# Patient Record
Sex: Female | Born: 1985 | Race: White | Hispanic: No | Marital: Single | Smoking: Never smoker
Health system: Southern US, Community
[De-identification: ages and names within clinical notes are randomized; demographics above are authoritative.]

## PROBLEM LIST (undated history)

## (undated) HISTORY — PX: LEFT HEART CATH: CATH118248

## (undated) HISTORY — PX: LOOP RECORDER REMOVAL: EP1215

## (undated) HISTORY — PX: LOOP RECORDER INSERTION: EP1214

---

## 2017-05-01 ENCOUNTER — Other Ambulatory Visit: Payer: Self-pay

## 2017-05-01 ENCOUNTER — Ambulatory Visit
Admission: EM | Admit: 2017-05-01 | Discharge: 2017-05-01 | Disposition: A | Discharge status: Home / Self Care | Payer: Medicaid Other | Attending: Emergency Medicine | Admitting: Emergency Medicine

## 2017-05-01 DIAGNOSIS — R05 Cough: Secondary | ICD-10-CM

## 2017-05-01 DIAGNOSIS — J111 Influenza due to unidentified influenza virus with other respiratory manifestations: Secondary | ICD-10-CM

## 2017-05-01 DIAGNOSIS — R509 Fever, unspecified: Secondary | ICD-10-CM

## 2017-05-01 DIAGNOSIS — J029 Acute pharyngitis, unspecified: Secondary | ICD-10-CM

## 2017-05-01 DIAGNOSIS — M791 Myalgia, unspecified site: Secondary | ICD-10-CM | POA: Diagnosis not present

## 2017-05-01 DIAGNOSIS — R69 Illness, unspecified: Secondary | ICD-10-CM

## 2017-05-01 MED ORDER — OSELTAMIVIR PHOSPHATE 75 MG PO CAPS: 75.0000 mg | ORAL_CAPSULE | Freq: Two times a day (BID) | ORAL | 0 refills | Status: AC

## 2017-05-01 NOTE — Discharge Instructions (Signed)
-  Tamiflu: one tablet twice a day for 5 days °-push fluids, rest °-ibuprofen or Tylenol for pain and fever °-follow up with PCP or return to clinic should symptoms worsen or not improve. °

## 2017-05-01 NOTE — ED Provider Notes (Signed)
Patient is a 32 year old [facility]    CSN: [id number] Arrival date & time: 05/01/17  1345     History   Chief Complaint Chief Complaint  Patient presents with  . Fever    HPI Alexis Frey is a 32 y.o. female.   Patient is a 32 year old female who presents with her daughter (who has similar symptoms) with complaint of body aches, chills, cough, scratchy throat and a fever with T-max of 103 that began Tuesday.  Patient reports that she has productive cough that is occasionally producing green mucus.  She reports some chest pain shortness of breath yesterday.  Patient denies any abdominal pain but does report some nausea.  Patient denies any sick contacts at work but she does present with her daughter who has similar symptoms.  Her other daughter was also sick over the weekend but is improved.      History reviewed. No pertinent past medical history.  There are no active problems to display for this patient.   Past Surgical History:  Procedure Laterality Date  . CESAREAN SECTION      x 3  . LEFT HEART CATH    . LOOP RECORDER INSERTION    . LOOP RECORDER REMOVAL      OB History    No data available       Home Medications    Prior to Admission medications   Medication Sig Start Date End Date Taking? Authorizing Provider  oseltamivir (TAMIFLU) 75 MG capsule Take 1 capsule (75 mg total) by mouth every 12 (twelve) hours. 05/01/17   Candis Schatz, PA-C    Family History Family History  Problem Relation Age of Onset  . Heart disease Father   . CVA Father   . Heart attack Father   . Kidney disease Father     Social History Social History   Tobacco Use  . Smoking status: Never Smoker  . Smokeless tobacco: Never Used  Substance Use Topics  . Alcohol use: No    Frequency: Never  . Drug use: No     Allergies   Benadryl [diphenhydramine] and Demerol [meperidine hcl]   Review of Systems Review of Systems  As noted above in HPI.  Other  systems reviewed and found to be negative   Physical Exam Triage Vital Signs ED Triage Vitals  Enc Vitals Group     BP 05/01/17 1421 109/60     Pulse Rate 05/01/17 1421 86     Resp 05/01/17 1421 16     Temp 05/01/17 1421 98.1 F (36.7 C)     Temp Source 05/01/17 1421 Oral     SpO2 05/01/17 1421 100 %     Weight --      Height 05/01/17 1418 5\' 5"  (1.651 m)     Head Circumference --      Peak Flow --      Pain Score 05/01/17 1418 5     Pain Loc --      Pain Edu? --      Excl. in GC? --    No data found.  Updated Vital Signs BP 109/60 (BP Location: Left Arm)   Pulse 86   Temp 98.1 F (36.7 C) (Oral)   Resp 16   Ht 5\' 5"  (1.651 m)   LMP 04/14/2017   SpO2 100%   Physical Exam  Constitutional: She is oriented to person, place, and time. She appears well-developed and well-nourished. No distress.  HENT:  Head: Normocephalic and  atraumatic.  Right Ear: Tympanic membrane and ear canal normal.  Left Ear: Tympanic membrane and ear canal normal.  Mouth/Throat: Uvula is midline.  Positive postnasal drainage, mild throat erythema  Eyes: EOM are normal. Pupils are equal, round, and reactive to light.  Neck: Normal range of motion.  Cardiovascular: Normal rate and regular rhythm. Exam reveals no friction rub.  No murmur heard. Pulmonary/Chest: Effort normal and breath sounds normal. No respiratory distress. She has no wheezes.  Abdominal: Soft.  Musculoskeletal: Normal range of motion.  Lymphadenopathy:    She has no cervical adenopathy.  Neurological: She is alert and oriented to person, place, and time. No cranial nerve deficit.  Skin: Skin is warm and dry.  Psychiatric: She has a normal mood and affect. Her behavior is normal.     UC Treatments / Results  Labs (all labs ordered are listed, but only abnormal results are displayed) Labs Reviewed - No data to display  EKG  EKG Interpretation None       Radiology No results found.  Procedures Procedures  (including critical care time)  Medications Ordered in UC Medications - No data to display   Initial Impression / Assessment and Plan / UC Course  I have reviewed the triage vital signs and the nursing notes.  Pertinent labs & imaging results that were available during my care of the patient were reviewed by me and considered in my medical decision making (see chart for details).     Patient presents with flulike illness with reported T-max of 103.  Patient also reports the same time as her daughter with similar symptoms.  We will go ahead and treat her for influenza with Tamiflu.  I have patient push fluids and ibuprofen or Tylenol for pain.  Follow with primary care or this clinic as needed.  Final Clinical Impressions(s) / UC Diagnoses   Final diagnoses:  Influenza-like illness    ED Discharge Orders        Ordered    oseltamivir (TAMIFLU) 75 MG capsule  Every 12 hours     05/01/17 1556       Controlled Substance Prescriptions  Controlled Substance Registry consulted? Not Applicable   Candis SchatzHarris, Havard Radigan D, PA-C 05/01/17 1558

## 2017-05-01 NOTE — ED Triage Notes (Signed)
Patient complains of cough, congestion, fever, body aches that started on Tuesday PM.

## 2018-01-01 ENCOUNTER — Encounter: Payer: Self-pay | Admitting: Emergency Medicine

## 2018-01-01 ENCOUNTER — Ambulatory Visit
Admission: EM | Admit: 2018-01-01 | Discharge: 2018-01-01 | Disposition: A | Discharge status: Home / Self Care | Payer: Medicaid Other | Attending: Family Medicine | Admitting: Family Medicine

## 2018-01-01 ENCOUNTER — Other Ambulatory Visit: Payer: Self-pay

## 2018-01-01 DIAGNOSIS — R1310 Dysphagia, unspecified: Secondary | ICD-10-CM | POA: Diagnosis not present

## 2018-01-01 DIAGNOSIS — Z8249 Family history of ischemic heart disease and other diseases of the circulatory system: Secondary | ICD-10-CM | POA: Diagnosis not present

## 2018-01-01 DIAGNOSIS — J020 Streptococcal pharyngitis: Secondary | ICD-10-CM

## 2018-01-01 DIAGNOSIS — J039 Acute tonsillitis, unspecified: Secondary | ICD-10-CM | POA: Diagnosis not present

## 2018-01-01 DIAGNOSIS — Z79899 Other long term (current) drug therapy: Secondary | ICD-10-CM | POA: Diagnosis not present

## 2018-01-01 DIAGNOSIS — J029 Acute pharyngitis, unspecified: Secondary | ICD-10-CM | POA: Diagnosis present

## 2018-01-01 LAB — RAPID STREP SCREEN (MED CTR MEBANE ONLY): Streptococcus, Group A Screen (Direct): POSITIVE — AB

## 2018-01-01 MED ORDER — LIDOCAINE VISCOUS HCL 2 % MT SOLN: 15.0000 mL | OROMUCOSAL | 0 refills | Status: AC | PRN

## 2018-01-01 MED ORDER — PENICILLIN G BENZATHINE 1200000 UNIT/2ML IM SUSP
1.2000 10*6.[IU] | Freq: Once | INTRAMUSCULAR | Status: AC
  Administered 2018-01-01: 1.2 10*6.[IU] via INTRAMUSCULAR

## 2018-01-01 NOTE — ED Triage Notes (Signed)
Patient sore throat that started this morning.

## 2018-01-01 NOTE — Discharge Instructions (Signed)
Your rapid strep test was positive today. You have been given an injection of penicillin today for the infection which should clear your infection in the next 1-3 days. If pain worsens or persists please f/u. May take Motrin and use viscous lidocaine for pain. Increase rest and fluids. F/u as needed.

## 2018-01-01 NOTE — ED Provider Notes (Signed)
[facility]    CSN: [id number] Arrival date & time: 01/01/18  1943     History   Chief Complaint Chief Complaint  Patient presents with  . Sore Throat    HPI Alexis Frey is a 32 y.o. female. Patient presents with sore throat x 1 day. States her daughter was recently seen in the clinic and treated for strep throat. Patient's throat pain is 5/10. Admits to painful swallowing and tender/enlarged lymph nodes of neck. Denies fever. Admits to feeling hot and cold and fatigued. Has not taken anything for symptoms at this time.  HPI  History reviewed. No pertinent past medical history.  There are no active problems to display for this patient.   Past Surgical History:  Procedure Laterality Date  . CESAREAN SECTION      x 3  . LEFT HEART CATH    . LOOP RECORDER INSERTION    . LOOP RECORDER REMOVAL      OB History   None      Home Medications    Prior to Admission medications   Medication Sig Start Date End Date Taking? Authorizing Provider  venlafaxine (EFFEXOR) 37.5 MG tablet Take 1 tab daily for a week then increase to 1 tab twice a day 11/12/17  Yes [provider]  lidocaine (XYLOCAINE) 2 % solution Use as directed 15 mLs in the mouth or throat every 3 (three) hours as needed for up to 2 days for mouth pain. 01/01/18 01/03/18  Shirlee Latch, PA-C  oseltamivir (TAMIFLU) 75 MG capsule Take 1 capsule (75 mg total) by mouth every 12 (twelve) hours. 05/01/17   Candis Schatz, PA-C    Family History Family History  Problem Relation Age of Onset  . Heart disease Father   . CVA Father   . Heart attack Father   . Kidney disease Father     Social History Social History   Tobacco Use  . Smoking status: Never Smoker  . Smokeless tobacco: Never Used  Substance Use Topics  . Alcohol use: No    Frequency: Never  . Drug use: No     Allergies   Benadryl [diphenhydramine] and Demerol [meperidine hcl]   Review of Systems Review of Systems   Constitutional: Positive for chills and fatigue. Negative for appetite change and fever.  HENT: Positive for sore throat and trouble swallowing. Negative for congestion, ear pain, rhinorrhea, sinus pressure and sinus pain.   Eyes: Negative for discharge and redness.  Respiratory: Negative for cough and shortness of breath.   Cardiovascular: Negative for chest pain.  Gastrointestinal: Negative for abdominal pain, nausea and vomiting.  Musculoskeletal: Negative for arthralgias and myalgias.  Skin: Negative for color change and rash.  Neurological: Negative for dizziness, weakness and headaches.  Hematological: Positive for adenopathy.     Physical Exam Triage Vital Signs ED Triage Vitals  Enc Vitals Group     BP 01/01/18 1948 123/69     Pulse Rate 01/01/18 1948 (!) 104     Resp 01/01/18 1948 14     Temp 01/01/18 1948 98.1 F (36.7 C)     Temp Source 01/01/18 1948 Oral     SpO2 01/01/18 1948 100 %     Weight 01/01/18 1946 197 lb (89.4 kg)     Height 01/01/18 1946 5\' 5"  (1.651 m)     Head Circumference --      Peak Flow --      Pain Score 01/01/18 1946 5  Pain Loc --      Pain Edu? --      Excl. in GC? --    No data found.  Updated Vital Signs BP 123/69 (BP Location: Left Arm)   Pulse (!) 104   Temp 98.1 F (36.7 C) (Oral)   Resp 14   Ht 5\' 5"  (1.651 m)   Wt 197 lb (89.4 kg)   LMP 12/11/2017 (Approximate)   SpO2 100%   BMI 32.78 kg/m    Physical Exam  Constitutional: She is oriented to person, place, and time. She appears well-developed and well-nourished.  Non-toxic appearance. She does not appear ill. No distress.  HENT:  Head: Normocephalic and atraumatic.  Right Ear: Hearing, tympanic membrane and ear canal normal.  Left Ear: Tympanic membrane and ear canal normal.  Mouth/Throat: Mucous membranes are normal. Posterior oropharyngeal erythema present. No oropharyngeal exudate or tonsillar abscesses. Tonsils are 3+ on the right. Tonsils are 2+ on the left.  Tonsillar exudate (bilateral whitish exudates ).  Neck: Neck supple.  Cardiovascular: Normal rate, regular rhythm and normal heart sounds.  No murmur heard. Pulmonary/Chest: Effort normal and breath sounds normal. No respiratory distress. She has no wheezes. She has no rhonchi.  Lymphadenopathy:    She has cervical adenopathy (bilat ant enlarged cervical nodes with + tenderness).  Neurological: She is alert and oriented to person, place, and time.  Skin: Skin is dry. No rash noted.  Psychiatric: She has a normal mood and affect. Her behavior is normal.  Nursing note and vitals reviewed.    UC Treatments / Results  Labs (all labs ordered are listed, but only abnormal results are displayed) Labs Reviewed  RAPID STREP SCREEN ([facility] ONLY) - Abnormal; Notable for the following components:      Result Value   Streptococcus, Group A Screen (Direct) POSITIVE (*)    All other components within normal limits    EKG None  Radiology No results found.  Procedures Procedures (including critical care time)  Medications Ordered in UC Medications  penicillin g benzathine (BICILLIN LA) 1200000 UNIT/2ML injection 1.2 Million Units (1.2 Million Units Intramuscular Given 01/01/18 2009)    Initial Impression / Assessment and Plan / UC Course  I have reviewed the triage vital signs and the nursing notes.  Pertinent labs & imaging results that were available during my care of the patient were reviewed by me and considered in my medical decision making (see chart for details).   Rapid strep test positive. Patient requested IM penicillin instead of oral medication. Increase rest and fluid intake. Discussed when to follow up for worsening of infection.    Final Clinical Impressions(s) / UC Diagnoses   Final diagnoses:  Strep pharyngitis  Tonsillitis  Dysphagia, unspecified type     Discharge Instructions     Your rapid strep test was positive today. You have been given an  injection of penicillin today for the infection which should clear your infection in the next 1-3 days. If pain worsens or persists please f/u. May take Motrin and use viscous lidocaine for pain. Increase rest and fluids. F/u as needed.    ED Prescriptions    Medication Sig Dispense Auth. Provider   lidocaine (XYLOCAINE) 2 % solution Use as directed 15 mLs in the mouth or throat every 3 (three) hours as needed for up to 2 days for mouth pain. 150 mL Eusebio Friendly B, PA-C     Controlled Substance Prescriptions [place] Controlled Substance Registry consulted? Not Applicable  Shirlee Latch, PA-C 01/03/18 1419

## 2018-01-01 NOTE — ED Notes (Signed)
Patient shows no signs of adverse reaction at this time.  

## 2018-05-05 ENCOUNTER — Other Ambulatory Visit: Payer: Self-pay | Admitting: Acute Care

## 2018-05-05 DIAGNOSIS — G4459 Other complicated headache syndrome: Secondary | ICD-10-CM

## 2018-05-15 ENCOUNTER — Other Ambulatory Visit: Payer: Self-pay

## 2018-05-15 ENCOUNTER — Ambulatory Visit
Admission: RE | Admit: 2018-05-15 | Discharge: 2018-05-15 | Disposition: A | Discharge status: Home / Self Care | Payer: 59 | Source: Ambulatory Visit | Attending: Acute Care | Admitting: Acute Care

## 2018-05-15 DIAGNOSIS — G4459 Other complicated headache syndrome: Secondary | ICD-10-CM | POA: Diagnosis present

## 2018-05-15 MED ORDER — GADOBUTROL 1 MMOL/ML IV SOLN
7.5000 mL | Freq: Once | INTRAVENOUS | Status: AC | PRN
  Administered 2018-05-15: 7.5 mL via INTRAVENOUS

## 2019-06-24 ENCOUNTER — Other Ambulatory Visit: Payer: Self-pay

## 2019-06-24 ENCOUNTER — Emergency Department
Admission: EM | Admit: 2019-06-24 | Discharge: 2019-06-24 | Disposition: A | Discharge status: Home / Self Care | Payer: 59 | Attending: Emergency Medicine | Admitting: Emergency Medicine

## 2019-06-24 ENCOUNTER — Emergency Department: Payer: 59

## 2019-06-24 DIAGNOSIS — H81399 Other peripheral vertigo, unspecified ear: Secondary | ICD-10-CM | POA: Diagnosis not present

## 2019-06-24 DIAGNOSIS — R079 Chest pain, unspecified: Secondary | ICD-10-CM

## 2019-06-24 DIAGNOSIS — R0789 Other chest pain: Secondary | ICD-10-CM | POA: Diagnosis present

## 2019-06-24 LAB — CBC
HCT: 40.7 % (ref 36.0–46.0)
Hemoglobin: 14 g/dL (ref 12.0–15.0)
MCH: 30.3 pg (ref 26.0–34.0)
MCHC: 34.4 g/dL (ref 30.0–36.0)
MCV: 88.1 fL (ref 80.0–100.0)
Platelets: 326 10*3/uL (ref 150–400)
RBC: 4.62 MIL/uL (ref 3.87–5.11)
RDW: 12.3 % (ref 11.5–15.5)
WBC: 7.2 10*3/uL (ref 4.0–10.5)
nRBC: 0 % (ref 0.0–0.2)

## 2019-06-24 LAB — BASIC METABOLIC PANEL
Anion gap: 8 (ref 5–15)
BUN: 12 mg/dL (ref 6–20)
CO2: 26 mmol/L (ref 22–32)
Calcium: 9.6 mg/dL (ref 8.9–10.3)
Chloride: 107 mmol/L (ref 98–111)
Creatinine, Ser: 0.7 mg/dL (ref 0.44–1.00)
GFR calc Af Amer: >60 mL/min (ref >60)
GFR calc non Af Amer: >60 mL/min (ref >60)
Glucose, Bld: 98 mg/dL (ref 70–99)
Potassium: 4.3 mmol/L (ref 3.5–5.1)
Sodium: 141 mmol/L (ref 135–145)

## 2019-06-24 LAB — TROPONIN I (HIGH SENSITIVITY)
Troponin I (High Sensitivity): <2 ng/L (ref <18)
Troponin I (High Sensitivity): <2 ng/L (ref <18)

## 2019-06-24 LAB — POCT PREGNANCY, URINE: Preg Test, Ur: NEGATIVE

## 2019-06-24 MED ORDER — IOHEXOL 350 MG/ML SOLN
75.0000 mL | Freq: Once | INTRAVENOUS | Status: AC | PRN
  Administered 2019-06-24: 13:00:00 75 mL via INTRAVENOUS

## 2019-06-24 MED ORDER — KETOROLAC TROMETHAMINE 30 MG/ML IJ SOLN
15.0000 mg | INTRAMUSCULAR | Status: AC
  Administered 2019-06-24: 12:00:00 15 mg via INTRAVENOUS
  Filled 2019-06-24: qty 1

## 2019-06-24 MED ORDER — MECLIZINE HCL 25 MG PO TABS: 25.0000 mg | ORAL_TABLET | Freq: Three times a day (TID) | ORAL | 1 refills | Status: AC | PRN

## 2019-06-24 MED ORDER — MECLIZINE HCL 25 MG PO TABS
50.0000 mg | ORAL_TABLET | Freq: Once | ORAL | Status: AC
  Administered 2019-06-24: 12:00:00 50 mg via ORAL
  Filled 2019-06-24: qty 2

## 2019-06-24 MED ORDER — SODIUM CHLORIDE 0.9% FLUSH
3.0000 mL | Freq: Once | INTRAVENOUS | Status: AC
  Administered 2019-06-24: 12:00:00 3 mL via INTRAVENOUS

## 2019-06-24 MED ORDER — LORAZEPAM 2 MG/ML IJ SOLN
1.0000 mg | Freq: Once | INTRAMUSCULAR | Status: AC
  Administered 2019-06-24: 1 mg via INTRAVENOUS
  Filled 2019-06-24: qty 1

## 2019-06-24 MED ORDER — SODIUM CHLORIDE 0.9 % IV BOLUS
1000.0000 mL | Freq: Once | INTRAVENOUS | Status: AC
Start: 2019-06-24 — End: 2019-06-24
  Administered 2019-06-24: 12:00:00 1000 mL via INTRAVENOUS

## 2019-06-24 MED ORDER — ONDANSETRON 4 MG PO TBDP: 4.0000 mg | ORAL_TABLET | Freq: Three times a day (TID) | ORAL | 0 refills | Status: AC | PRN

## 2019-06-24 NOTE — Discharge Instructions (Signed)
Your labs and CT scan of the chest were all okay today.  Take meclizine to help control the vertigo, and stay well-hydrated.  Drinking caffeine can also help.  Please follow-up with your doctor for further evaluation of your symptoms.

## 2019-06-24 NOTE — ED Provider Notes (Signed)
[facility] Emergency Department Provider Note  ____________________________________________  Time seen: Approximately 1:21 PM  I have reviewed the triage vital signs and the nursing notes.   HISTORY  Chief Complaint Chest Pain    HPI Alexis Frey is a 34 y.o. female with a history of atypical chest pain and syncope more than 3 years ago who comes the ED complaining of sudden onset of dizziness and room spinning feeling that started as she sat up to get out of bed this morning.  Symptom was associated with a feeling of sharp pain in the left upper chest radiating into the left neck and the left arm.  The pain has been constant without alleviating factors  throughout the morning.  She states that it is better now, but was aggravated by lifting her arm to have a chest x-ray taken earlier in the ED.  Reports extensive cardiac work-up in [place] prior to moving to [place] 3 years ago.  She had a loop recorder, cardiac catheterization, other testing without any definitive conclusions.  Not currently taking any medications.  Denies any paresthesia or motor weakness.  No headache or syncope.  No vision change.     History reviewed. No pertinent past medical history.   There are no problems to display for this patient.    Past Surgical History:  Procedure Laterality Date  . CESAREAN SECTION      x 3  . LEFT HEART CATH    . LOOP RECORDER INSERTION    . LOOP RECORDER REMOVAL       Prior to Admission medications   Medication Sig Start Date End Date Taking? Authorizing Provider  meclizine (ANTIVERT) 25 MG tablet Take 1 tablet (25 mg total) by mouth 3 (three) times daily as needed for dizziness or nausea. 06/24/19   Sharman Cheek, MD  ondansetron (ZOFRAN ODT) 4 MG disintegrating tablet Take 1 tablet (4 mg total) by mouth every 8 (eight) hours as needed for nausea or vomiting. 06/24/19   Sharman Cheek, MD  oseltamivir (TAMIFLU) 75 MG capsule Take 1  capsule (75 mg total) by mouth every 12 (twelve) hours. 05/01/17   Candis Schatz, PA-C  venlafaxine [facility]) 37.5 MG tablet Take 1 tab daily for a week then increase to 1 tab twice a day 11/12/17   [provider]     Allergies Benadryl [diphenhydramine] and Demerol [meperidine hcl]   Family History  Problem Relation Age of Onset  . Heart disease Father   . CVA Father   . Heart attack Father   . Kidney disease Father     Social History Social History   Tobacco Use  . Smoking status: Never Smoker  . Smokeless tobacco: Never Used  Substance Use Topics  . Alcohol use: No  . Drug use: No    Review of Systems  Constitutional:   No fever or chills.  ENT:   No sore throat. No rhinorrhea. Cardiovascular: Positive chest pain as above without palpitations or syncope. Respiratory:   No dyspnea or cough. Gastrointestinal:   Negative for abdominal pain, vomiting and diarrhea.  Musculoskeletal:   Negative for focal pain or swelling All other systems reviewed and are negative except as documented above in ROS and HPI.  ____________________________________________   PHYSICAL EXAM:  VITAL SIGNS: ED Triage Vitals  Enc Vitals Group     BP 06/24/19 0904 115/71     Pulse Rate 06/24/19 0904 80     Resp 06/24/19 0904 16  Temp 06/24/19 0904 98.2 F (36.8 C)     Temp Source 06/24/19 0904 Oral     SpO2 06/24/19 0904 100 %     Weight 06/24/19 0900 197 lb (89.4 kg)     Height 06/24/19 0900 5\' 5"  (1.651 m)     Head Circumference --      Peak Flow --      Pain Score 06/24/19 0859 7     Pain Loc --      Pain Edu? --      Excl. in [place]? --     Vital signs reviewed, nursing assessments reviewed.   Constitutional:   Alert and oriented. Non-toxic appearance. Eyes:   Conjunctivae are normal. EOMI. PERRL. ENT      Head:   Normocephalic and atraumatic.      Nose:   Wearing a mask.      Mouth/Throat:   Wearing a mask.      Neck:   No meningismus. Full  ROM. Hematological/Lymphatic/Immunilogical:   No cervical lymphadenopathy. Cardiovascular:   RRR. Symmetric bilateral radial and DP pulses.  No murmurs. Cap refill less than 2 seconds. Respiratory:   Normal respiratory effort without tachypnea/retractions. Breath sounds are clear and equal bilaterally. No wheezes/rales/rhonchi. Gastrointestinal:   Soft and nontender. Non distended.   No rebound, rigidity, or guarding.  Musculoskeletal:   Normal range of motion in all extremities. No joint effusions.  No lower extremity tenderness.  No edema.  Chest pain nonreproducible as demonstrated by patient.  No pain with range of motion of the left shoulder Neurologic:   Normal speech and language.  Motor grossly intact. No acute focal neurologic deficits are appreciated.  Skin:    Skin is warm, dry and intact. No rash noted.  No petechiae, purpura, or bullae.  ____________________________________________    LABS (pertinent positives/negatives) (all labs ordered are listed, but only abnormal results are displayed) Labs Reviewed  BASIC METABOLIC PANEL  CBC  POC URINE PREG, ED  POCT PREGNANCY, URINE  TROPONIN I (HIGH SENSITIVITY)  TROPONIN I (HIGH SENSITIVITY)   ____________________________________________   EKG  Interpreted by me Sinus rhythm rate of 84, normal axis and intervals.  Normal QRS ST segments.  Isolated T wave inversion in lead III which is nonspecific.  No evidence of right heart strain.  ____________________________________________    RADIOLOGY  DG Chest 2 View  Result Date: 06/24/2019 CLINICAL DATA:  Chest pain EXAM: CHEST - 2 VIEW COMPARISON:  None. FINDINGS: The heart size and mediastinal contours are within normal limits. Both lungs are clear. Pectus excavatum and mild upper thoracic levoscoliosis. Cholecystectomy clips. IMPRESSION: No active cardiopulmonary disease. Electronically Signed   By: Monte Fantasia M.D.   On: 06/24/2019 09:37   CT ANGIO CHEST AORTA W/CM &  OR WO/CM  Result Date: 06/24/2019 CLINICAL DATA:  Left arm pain and dizziness. EXAM: CT ANGIOGRAPHY CHEST WITH CONTRAST TECHNIQUE: Multidetector CT imaging of the chest was performed using the standard protocol during bolus administration of intravenous contrast. Multiplanar CT image reconstructions and MIPs were obtained to evaluate the vascular anatomy. CONTRAST:  40mL OMNIPAQUE IOHEXOL 350 MG/ML SOLN COMPARISON:  None. FINDINGS: Cardiovascular: Preferential opacification of the thoracic aorta. No evidence of thoracic aortic aneurysm or dissection. Normal heart size. No pericardial effusion. Mediastinum/Nodes: No enlarged mediastinal, hilar, or axillary lymph nodes. Thyroid gland, trachea, and esophagus demonstrate no significant findings. Lungs/Pleura: Lungs are clear. No pleural effusion or pneumothorax. Upper Abdomen: No acute abnormality. Musculoskeletal: No chest wall abnormality. No acute or  significant osseous findings. Review of the MIP images confirms the above findings. IMPRESSION: No evidence of thoracic aortic aneurysm or dissection. No acute abnormality seen in the chest. Electronically Signed   By: Lupita Raider M.D.   On: 06/24/2019 12:53    ____________________________________________   PROCEDURES Procedures  ____________________________________________  DIFFERENTIAL DIAGNOSIS   Peripheral vertigo, aortic dissection, musculoskeletal chest pain/intercostal strain  CLINICAL IMPRESSION / ASSESSMENT AND PLAN / ED COURSE  Medications ordered in the ED: Medications  sodium chloride flush (NS) 0.9 % injection 3 mL (3 mLs Intravenous Given 06/24/19 1215)  sodium chloride 0.9 % bolus 1,000 mL (1,000 mLs Intravenous New Bag/Given 06/24/19 1215)  ketorolac (TORADOL) 30 MG/ML injection 15 mg (15 mg Intravenous Given 06/24/19 1220)  meclizine (ANTIVERT) tablet 50 mg (50 mg Oral Given 06/24/19 1225)  LORazepam (ATIVAN) injection 1 mg (1 mg Intravenous Given 06/24/19 1222)  iohexol  (OMNIPAQUE) 350 MG/ML injection 75 mL (75 mLs Intravenous Contrast Given 06/24/19 1230)    Pertinent labs & imaging results that were available during my care of the patient were reviewed by me and considered in my medical decision making (see chart for details).  Emiyah Spraggins was evaluated in Emergency Department on 06/24/2019 for the symptoms described in the history of present illness. She was evaluated in the context of the global COVID-19 pandemic, which necessitated consideration that the patient might be at risk for infection with the SARS-CoV-2 virus that causes COVID-19. Institutional protocols and algorithms that pertain to the evaluation of patients at risk for COVID-19 are in a state of rapid change based on information released by regulatory bodies including the CDC and federal and state organizations. These policies and algorithms were followed during the patient's care in the ED.   Patient presents with atypical chest pain which is most likely musculoskeletal.  However with her complicated cardiac work-up history the did not draw any definitive conclusions, radiating pain to the neck and arm, concerned about possible dissection.  Doubt PE ACS or carditis.  Chest x-ray is unremarkable.  Vital signs are normal which makes PE increasingly unlikely.  Pregnancy test negative, labs negative including 2 troponins.  EKG unremarkable, CT angiogram negative for dissection or other acute findings.  Vitals remain normal.  Stable for discharge home, treatment for peripheral vertigo and musculoskeletal pain, focusing on meclizine, hydration and Epley maneuver.  NSAIDs as needed.      ____________________________________________   FINAL CLINICAL IMPRESSION(S) / ED DIAGNOSES    Final diagnoses:  Peripheral vertigo, unspecified laterality  Nonspecific chest pain     ED Discharge Orders         Ordered    meclizine (ANTIVERT) 25 MG tablet  3 times daily PRN     06/24/19 1320    ondansetron  (ZOFRAN ODT) 4 MG disintegrating tablet  Every 8 hours PRN     06/24/19 1320          Portions of this note were generated with dragon dictation software. Dictation errors may occur despite best attempts at proofreading.   Sharman Cheek, MD 06/24/19 [phone]

## 2019-06-24 NOTE — ED Notes (Signed)
Pt signed physical discharge form. 

## 2019-06-24 NOTE — ED Triage Notes (Signed)
Pt states she was getting up to take her kids to school and had sudden onset dizziness with left arm and neck pain. Pt is in NAD at present.

## 2020-11-08 IMAGING — CT CT ANGIO CHEST
4 of 7 series · 18 of 46 positions shown · IV contrast (APPLIED)
Comparison: None.

CLINICAL DATA: Left arm pain and dizziness.

EXAM:
CT ANGIOGRAPHY CHEST WITH CONTRAST
TECHNIQUE: Multidetector CT imaging of the chest was performed using the
standard protocol during bolus administration of intravenous
contrast. Multiplanar CT image reconstructions and MIPs were
obtained to evaluate the vascular anatomy.
CONTRAST:  75mL OMNIPAQUE IOHEXOL 350 MG/ML SOLN

[Series 4: axial pre · axial · non-contrast · 0.68mm/px · z∈[-255,-55]mm · 5 of 62 slices shown]
[im 11/62  lung]
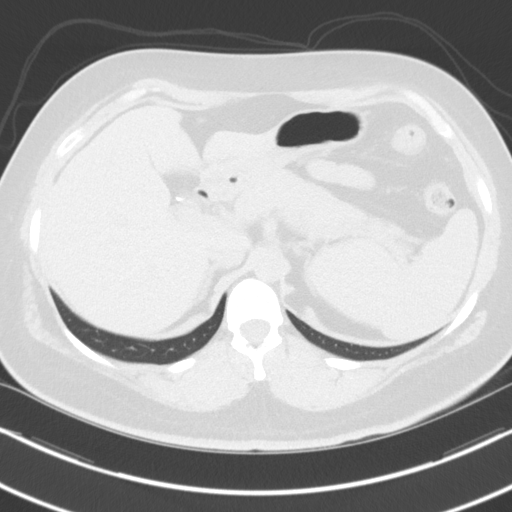
[im 21/62  lung]
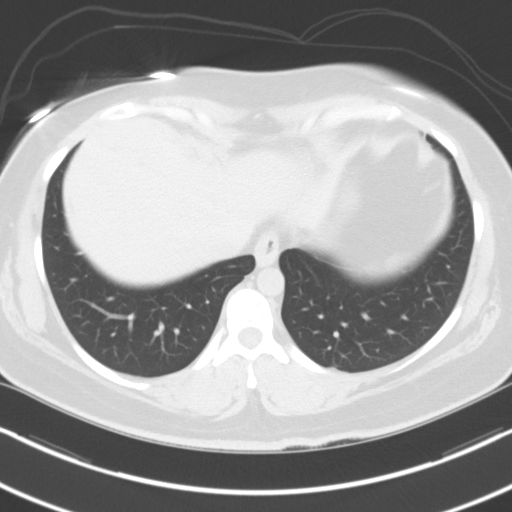
[im 31/62  lung]
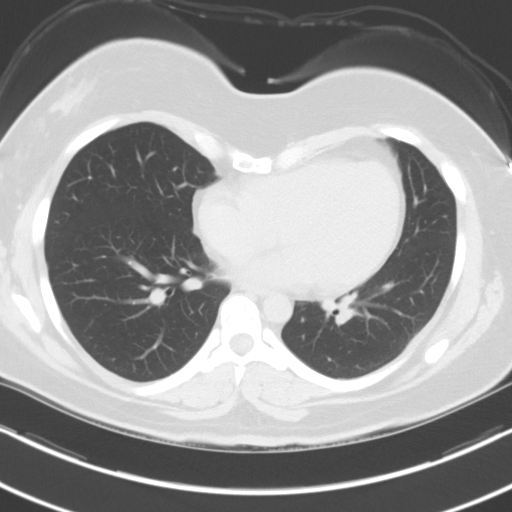
[im 41/62  lung]
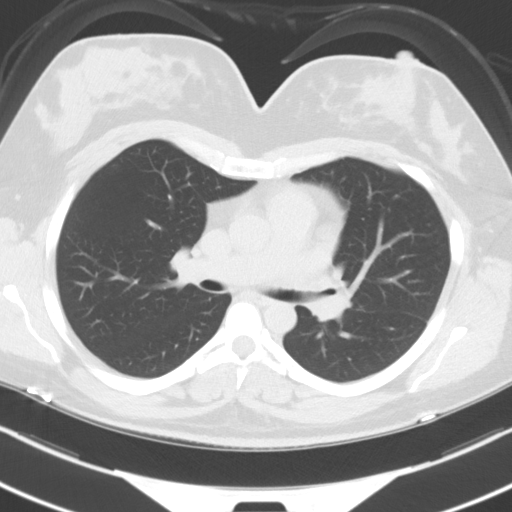
[im 51/62  lung]
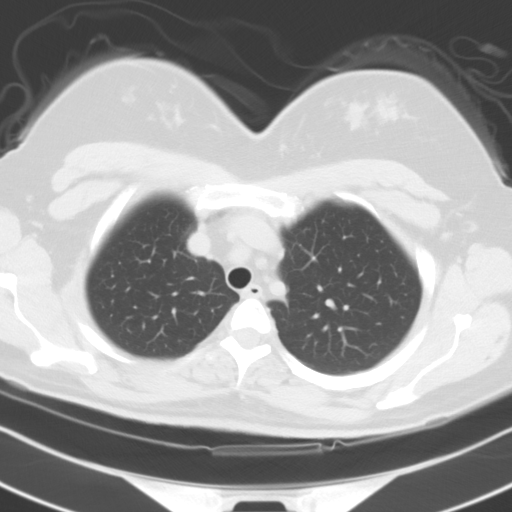

[Series 5: axial arterial · axial · arterial · 0.68mm/px · z∈[-267,-36]mm · 8 of 101 slices shown]
[im 12/101  lung]
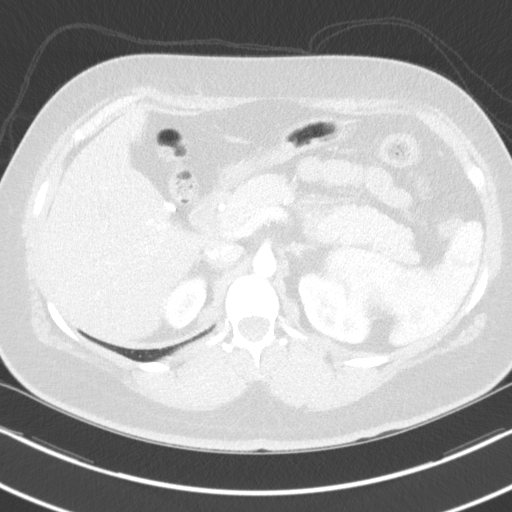
[im 23/101  soft-tissue]
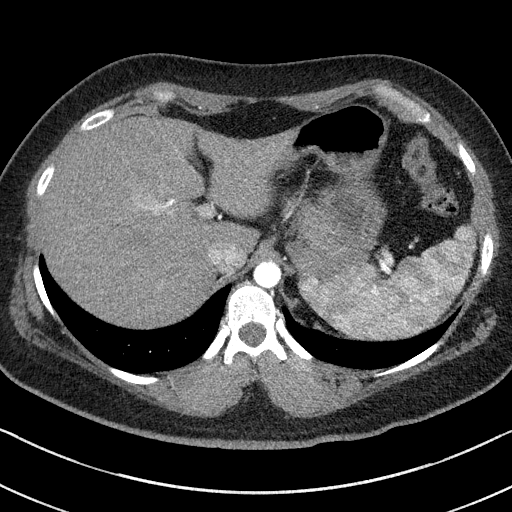
[im 34/101  lung]
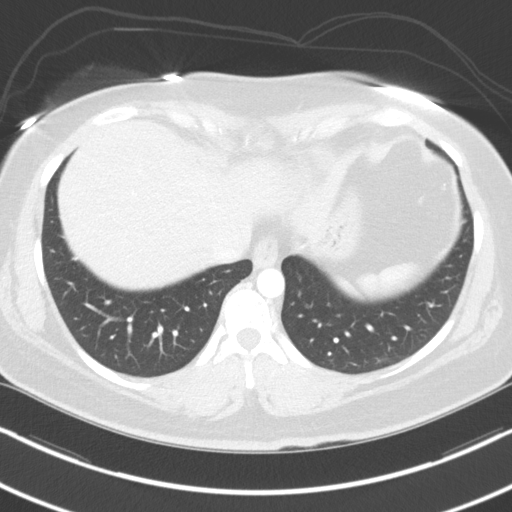
[im 45/101  soft-tissue]
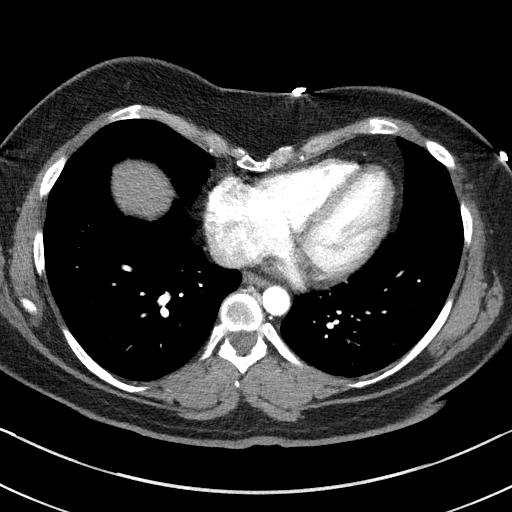
[im 56/101  lung]
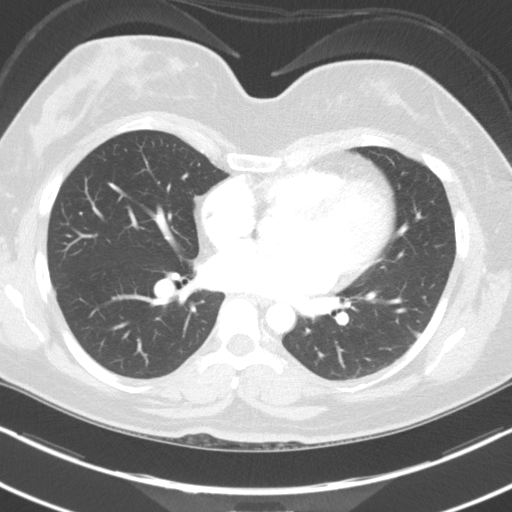
[im 67/101  soft-tissue]
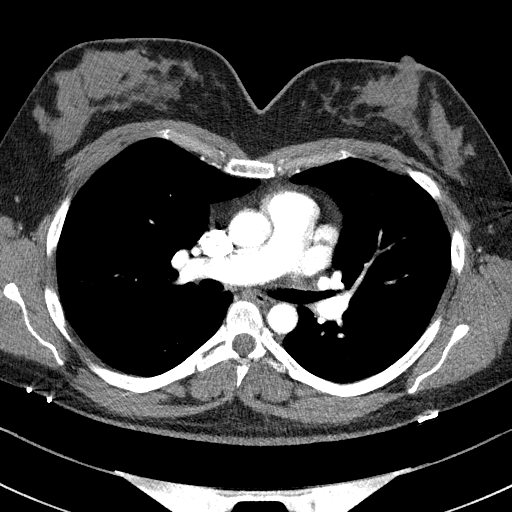
[im 78/101  lung]
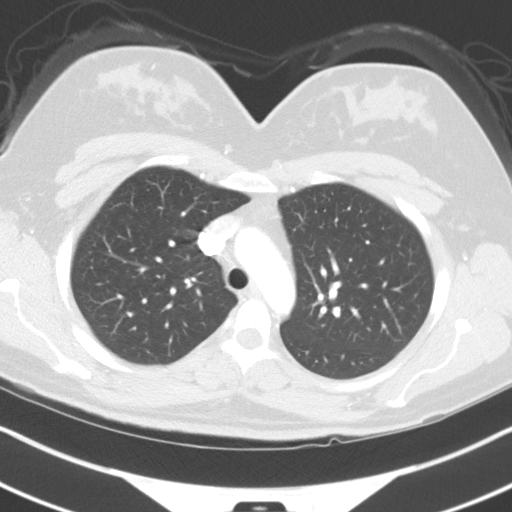
[im 89/101  soft-tissue]
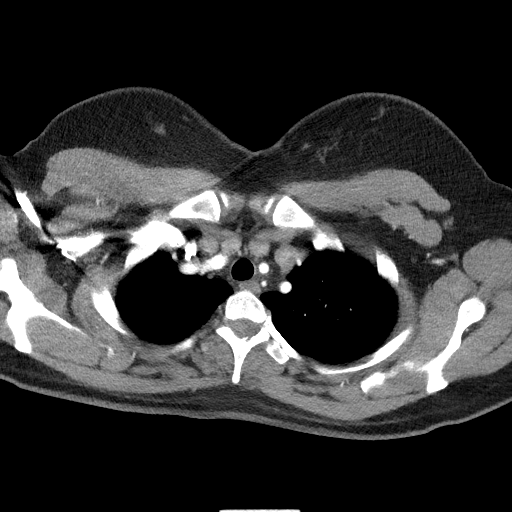

[Series 6: lung · axial · 0.68mm/px · z∈[-282,-238]mm · 2 of 152 slices shown]
[im 11/152  soft-tissue]
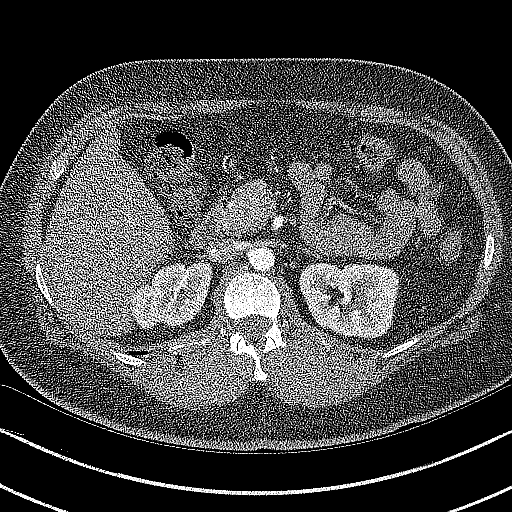
[im 33/152  soft-tissue]
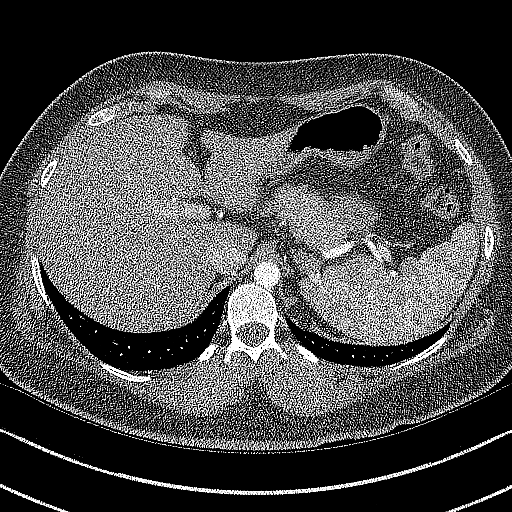

[Series 8: coronals · coronal · 0.66mm/px · 3 of 128 slices shown]
[im 32/128  soft-tissue]
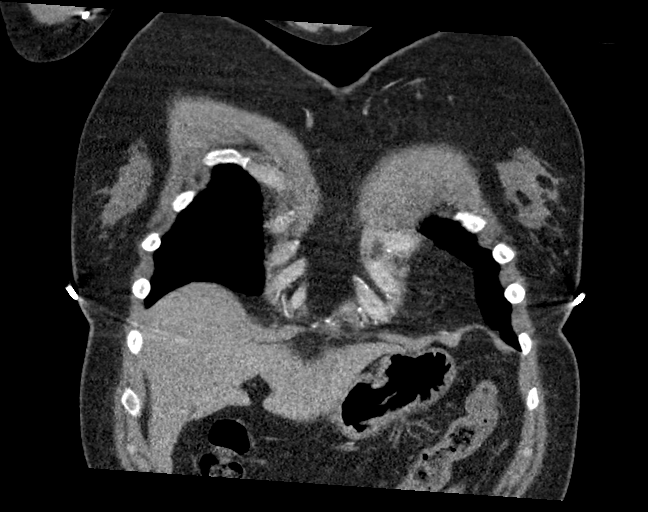
[im 64/128  soft-tissue]
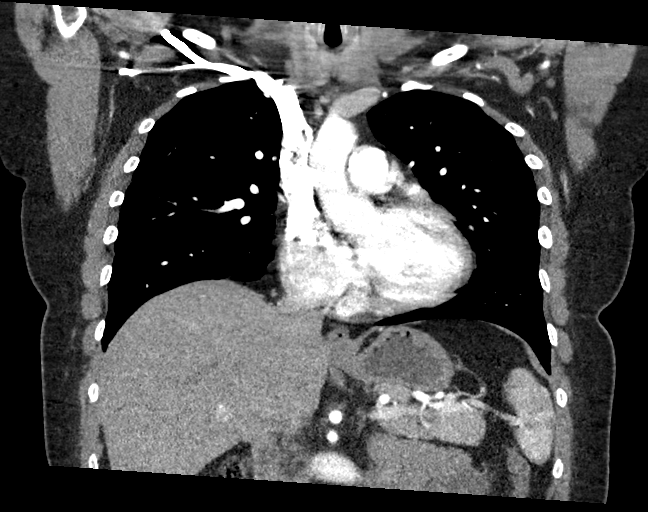
[im 96/128  soft-tissue]
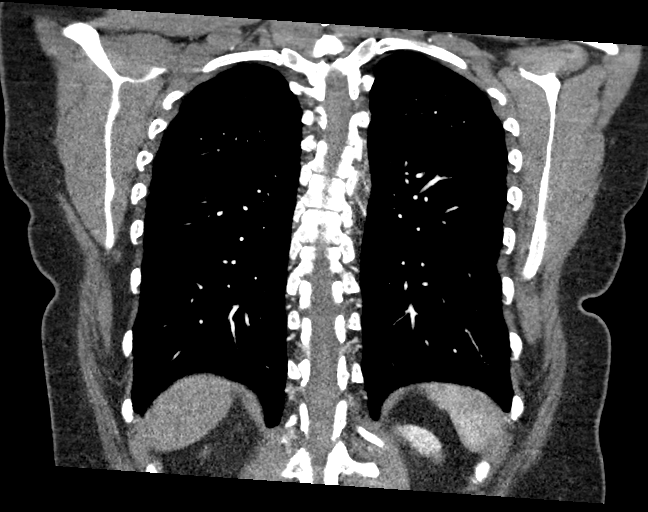

[18 of 46 positions shown; findings below may reference images not displayed]

FINDINGS: Cardiovascular: Preferential opacification of the thoracic aorta. No
evidence of thoracic aortic aneurysm or dissection. Normal heart
size. No pericardial effusion.

Mediastinum/Nodes: No enlarged mediastinal, hilar, or axillary lymph
nodes. Thyroid gland, trachea, and esophagus demonstrate no
significant findings.

Lungs/Pleura: Lungs are clear. No pleural effusion or pneumothorax.

Upper Abdomen: No acute abnormality.

Musculoskeletal: No chest wall abnormality. No acute or significant
osseous findings.

Review of the MIP images confirms the above findings.
IMPRESSION: No evidence of thoracic aortic aneurysm or dissection. No acute
abnormality seen in the chest.

## 2020-11-08 IMAGING — CR DG CHEST 2V
2 series · 2 of 2 positions shown · non-contrast
Comparison: None.

CLINICAL DATA: Chest pain

EXAM:
CHEST - 2 VIEW

[chest pa]
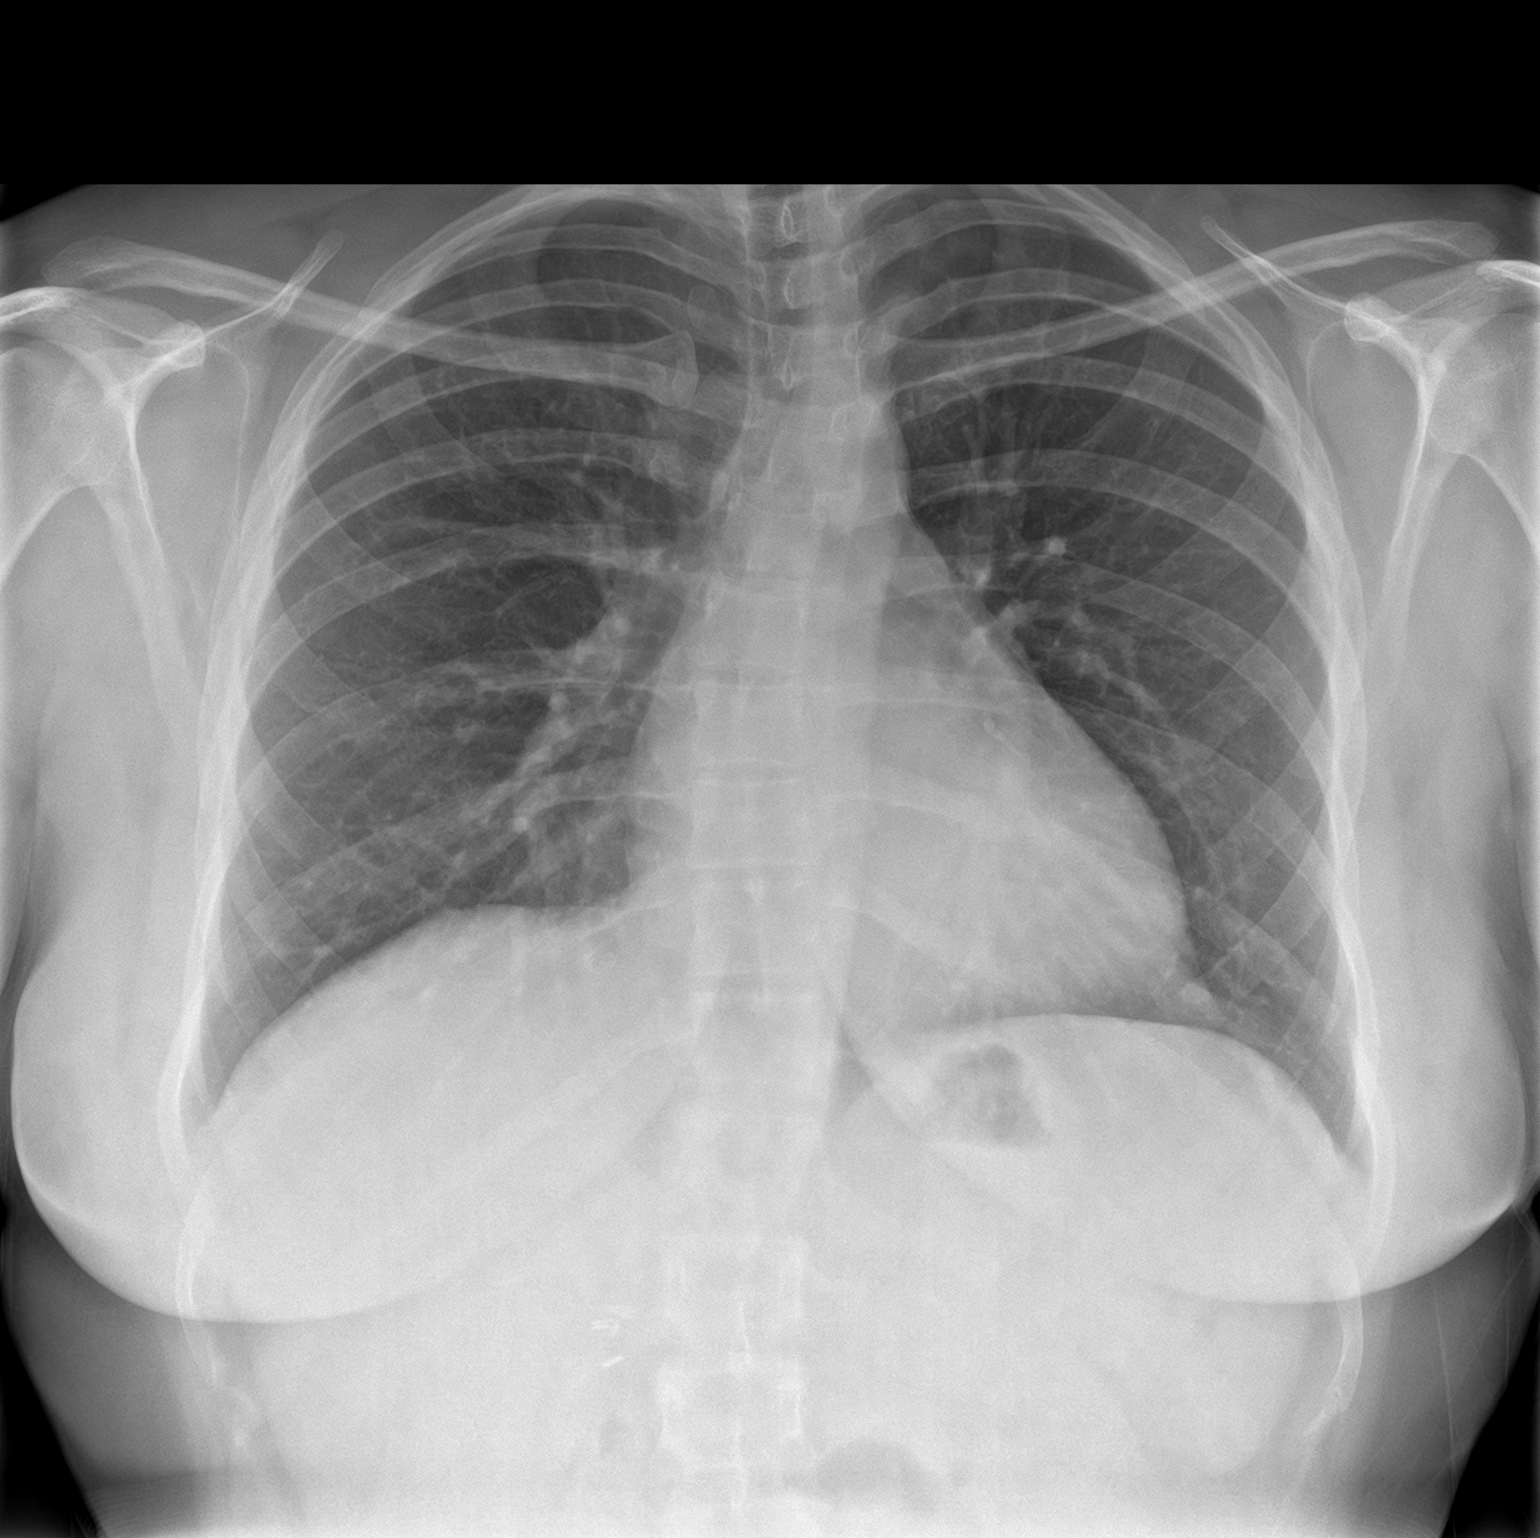

[chest lat]
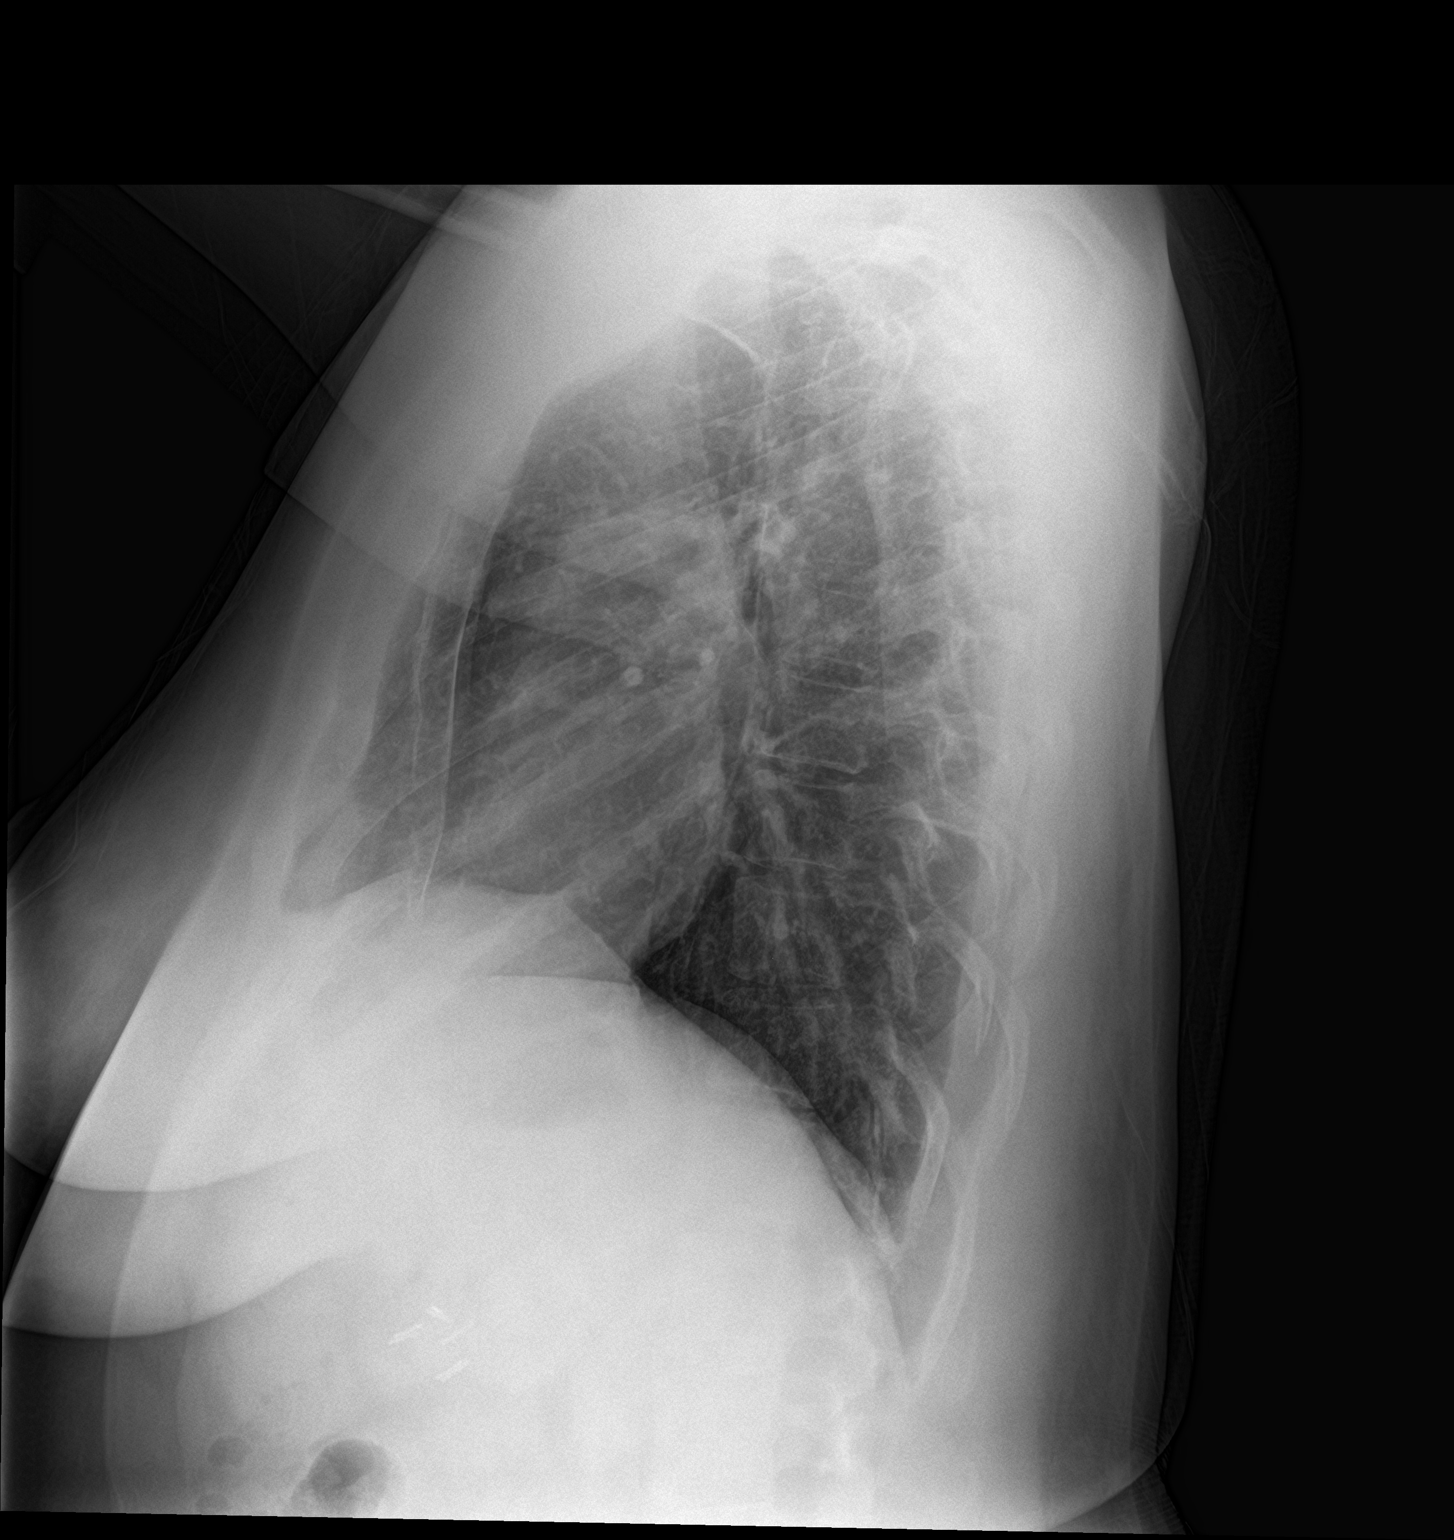

[2 of 2 positions shown; findings below may reference images not displayed]

FINDINGS: The heart size and mediastinal contours are within normal limits.
Both lungs are clear. Pectus excavatum and mild upper thoracic
levoscoliosis. Cholecystectomy clips.
IMPRESSION: No active cardiopulmonary disease.
